# Patient Record
Sex: Male | Born: 2000 | Race: White | Hispanic: No | Marital: Single | Smoking: Never smoker
Health system: Southern US, Community
[De-identification: ages and names within clinical notes are randomized; demographics above are authoritative.]

---
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
## 2020-09-29 ENCOUNTER — Other Ambulatory Visit: Payer: Self-pay

## 2020-09-29 ENCOUNTER — Encounter: Payer: Self-pay | Admitting: Emergency Medicine

## 2020-09-29 ENCOUNTER — Emergency Department
Admission: EM | Admit: 2020-09-29 | Discharge: 2020-09-29 | Disposition: A | Discharge status: Home / Self Care | Payer: No Typology Code available for payment source | Attending: Emergency Medicine | Admitting: Emergency Medicine

## 2020-09-29 ENCOUNTER — Emergency Department: Payer: No Typology Code available for payment source

## 2020-09-29 DIAGNOSIS — Y92009 Unspecified place in unspecified non-institutional (private) residence as the place of occurrence of the external cause: Secondary | ICD-10-CM | POA: Insufficient documentation

## 2020-09-29 DIAGNOSIS — W500XXA Accidental hit or strike by another person, initial encounter: Secondary | ICD-10-CM | POA: Insufficient documentation

## 2020-09-29 DIAGNOSIS — H02846 Edema of left eye, unspecified eyelid: Secondary | ICD-10-CM | POA: Insufficient documentation

## 2020-09-29 DIAGNOSIS — G44311 Acute post-traumatic headache, intractable: Secondary | ICD-10-CM | POA: Insufficient documentation

## 2020-09-29 DIAGNOSIS — Y9389 Activity, other specified: Secondary | ICD-10-CM | POA: Insufficient documentation

## 2020-09-29 DIAGNOSIS — S0990XA Unspecified injury of head, initial encounter: Secondary | ICD-10-CM | POA: Diagnosis not present

## 2020-09-29 DIAGNOSIS — R600 Localized edema: Secondary | ICD-10-CM

## 2020-09-29 NOTE — Discharge Instructions (Addendum)
You were seen today for headache, dizziness, intermittent blurred vision, swelling around her left eye after head injury.  The CT of your head was negative for acute findings.  The CT of your face was negative for any bony fractures.  We recommend ice for 10 minutes twice daily and ibuprofen 400 mg every 8 hours as needed with food.  Follow-up if you develop worsening headache, blurred vision, confusion, amnesia, nausea, vomiting.

## 2020-09-29 NOTE — ED Provider Notes (Signed)
[facility] Emergency Department Provider Note ____________________________________________  Time seen: 1430  I have reviewed the triage vital signs and the nursing notes.  HISTORY  Chief Complaint  Head Injury   HPI Frederick Dixon is a 20 y.o. male presents to the ER today with complaint of head injury.  He reports he was playing kickball approximately an hour and half ago when he ran directly into someone else's face.  He reports that her jaw hit his left eye and possibly nose.  He describes the pain as sore and achy.  He reports associated headache which he describes as throbbing.  He reports some mild dizziness and blurred vision when he looks side to side.  He denies confusion, excessive sleepiness, bloody or clear discharge from the nostrils, neck pain, nausea or vomiting.  He has not taken any medications for this PTA.  No past medical history on file.  There are no problems to display for this patient.     Prior to Admission medications   Not on File    Allergies Patient has no known allergies.  No family history on file.  Social History Social History   Tobacco Use  . Smoking status: Never Smoker  . Smokeless tobacco: Never Used  Vaping Use  . Vaping Use: Never used  Substance Use Topics  . Alcohol use: Yes  . Drug use: Never    Review of Systems  Constitutional: Negative for fever. Eyes: Positive for blurred vision when he looks side to side. ENT: Negative for bloody or clear discharge from the nose. Cardiovascular: Negative for chest pain or chest tightness. Respiratory: Negative for cough or shortness of breath. Gastrointestinal: Negative for nausea or vomiting Musculoskeletal: Negative for neck or back pain. Skin: Positive for swelling around the left eye.  Negative for abrasion. Neurological: Positive for headache, dizziness.  Negative for focal weakness, tingling or  numbness. ____________________________________________  PHYSICAL EXAM:  VITAL SIGNS: ED Triage Vitals  Enc Vitals Group     BP 09/29/20 1356 103/68     Pulse Rate 09/29/20 1356 77     Resp 09/29/20 1356 20     Temp 09/29/20 1356 98.5 F (36.9 C)     Temp Source 09/29/20 1356 Oral     SpO2 09/29/20 1356 97 %     Weight 09/29/20 1357 115 lb (52.2 kg)     Height 09/29/20 1357 5\' 8"  (1.727 m)     Head Circumference --      Peak Flow --      Pain Score 09/29/20 1357 6     Pain Loc --      Pain Edu? --      Excl. in GC? --     Constitutional: Alert and oriented. Well appearing and in no distress. Head: Normocephalic. Eyes: Sclera white.  Conjunctivae are normal. PERRL. Normal extraocular movements Ears: Canals clear. TMs intact bilaterally. Nose: No discharge noted. Cardiovascular: Normal rate, regular rhythm. Respiratory: Normal respiratory effort. No wheezes/rales/rhonchi. Musculoskeletal: Normal flexion, extension and rotation of the cervical spine.  No bony tenderness noted over the cervical spine.  Shoulder shrugs equal. Neurologic:  Normal gait without ataxia. Normal speech and language. No gross focal neurologic deficits are appreciated. Skin:  Skin is warm, dry and intact.  Left periorbital swelling noted. ____________________________________________   RADIOLOGY   Imaging Orders     CT Head Wo Contrast     CT Maxillofacial Wo Contrast IMPRESSION:  1. No acute intracranial abnormality.  2. No acute  displaced facial fracture.   ____________________________________________   INITIAL IMPRESSION / ASSESSMENT AND PLAN / ED COURSE  Acute Headache, Dizziness, Blurred Vision, Left Periorbital Swelling, Nasal Swelling s/p Head Injury:  DDx include posttraumatic headache, concussion, soft tissue swelling of the face, swelling of the nasal bridge. CT head negative CT maxillofacial negative D/w pt this is likely just soft tissue swelling and posttraumatic  headache Recommend ice for 10 minutes to left eye BID prn Recommend Ibuprofen 400 mg every 8 hours as needed with food- he declines RX for this Discussed s/s of concussion Return precautions discussed     ____________________________________________  FINAL CLINICAL IMPRESSION(S) / ED DIAGNOSES  Final diagnoses:  Injury of head, initial encounter  Periorbital edema of left eye  Intractable acute post-traumatic headache      Lorre Munroe, NP 09/29/20 1517    Delton Prairie, MD 09/29/20 [phone]

## 2020-09-29 NOTE — ED Notes (Signed)
Patient transported to CT 

## 2020-09-29 NOTE — ED Triage Notes (Signed)
Pt via POV from home. Pt was playing kickball, accidentally head butted someone approx 1 hour ago. Denies LOC. Swelling noted around the L eye. Pt states he also chipped one of his teeth. Pt is A&Ox4 and NAD.

## 2022-04-14 ENCOUNTER — Ambulatory Visit (INDEPENDENT_AMBULATORY_CARE_PROVIDER_SITE_OTHER): Payer: 59 | Admitting: Adult Health

## 2022-04-14 ENCOUNTER — Encounter: Payer: Self-pay | Admitting: Adult Health

## 2022-04-14 VITALS — BP 107/79 | HR 102 | Temp 98.9°F | Wt 124.0 lb

## 2022-04-14 DIAGNOSIS — J309 Allergic rhinitis, unspecified: Secondary | ICD-10-CM | POA: Diagnosis not present

## 2022-04-14 DIAGNOSIS — R051 Acute cough: Secondary | ICD-10-CM

## 2022-04-14 NOTE — Progress Notes (Signed)
[address] [place], [place] [address] Phone: [phone] Fax: [phone]   Office Visit Note  Patient Name: Frederick Dixon  Date of [id number]  Med Rec number [id number]  Date of Service: 04/14/2022  Patient has no known allergies.  Chief Complaint  Patient presents with   Cough   Nasal Congestion     Cough Associated symptoms include chills, ear pain, headaches and a sore throat. Pertinent negatives include no chest pain or fever.    Patient reports he has bee sick for about 10 days. It started with cough, and progressed to congestion, and now sore throat and head congestion. He has taken some decongestants.  He denies headache, fever or chills.  He does have some ear pressure.  Denies any close contacts who are sick.   Current Medication:  Outpatient Encounter Medications as of 04/14/2022  Medication Sig   FLUoxetine (PROZAC) 20 MG tablet Take 20 mg by mouth daily.   No facility-administered encounter medications on file as of 04/14/2022.      Medical History: History reviewed. No pertinent past medical history.   Vital Signs: BP 107/79   Pulse (!) 102   Temp 98.9 F (37.2 C) (Tympanic)   Wt 124 lb (56.2 kg)   SpO2 99%   BMI 18.85 kg/m    Review of Systems  Constitutional:  Positive for chills. Negative for diaphoresis, fatigue and fever.  HENT:  Positive for congestion, ear pain, sinus pressure and sore throat.   Eyes:  Negative for pain and itching.  Respiratory:  Positive for cough.   Cardiovascular:  Negative for chest pain.  Gastrointestinal:  Negative for diarrhea, nausea and vomiting.  Neurological:  Positive for headaches.    Physical Exam Vitals and nursing note reviewed.  Constitutional:      Appearance: Normal appearance.  HENT:     Head: Normocephalic.     Right Ear: Tympanic membrane and ear canal normal.     Left Ear: Tympanic membrane and ear canal normal.     Nose: Nose normal.     Right  Turbinates: Pale.     Left Turbinates: Pale.     Right Sinus: No maxillary sinus tenderness or frontal sinus tenderness.     Left Sinus: No frontal sinus tenderness.     Mouth/Throat:     Mouth: Mucous membranes are moist.  Eyes:     Pupils: Pupils are equal, round, and reactive to light.  Cardiovascular:     Rate and Rhythm: Normal rate.  Pulmonary:     Effort: Pulmonary effort is normal. No respiratory distress.     Breath sounds: No wheezing or rales.  Lymphadenopathy:     Cervical: No cervical adenopathy.  Neurological:     Mental Status: He is alert.    Assessment/Plan: 1. Allergic rhinitis, unspecified seasonality, unspecified trigger Discussed using Daily allergy medication such as Zyrtec or Claritin. Also instructed patient to use Flonase, Two sprays in each nostril twice daily. Follow up via MyChart messenger if symptoms fail to improve or may return to clinic as needed for worsening symptoms.    2. Acute cough Discussed albuterol inhaler, pt declined inhaler at this time.  Discussed taking OTC meds like nyquil or delsym for cough.     General Counseling: skye rodarte understanding of the findings of todays visit and agrees with plan of treatment. I have discussed any further diagnostic evaluation that may be needed or ordered today. We also reviewed his medications today. he has  been encouraged to call the office with any questions or concerns that should arise related to todays visit.   No orders of the defined types were placed in this encounter.   No orders of the defined types were placed in this encounter.   Time spent:20 Minutes Time spent includes review of chart, medications, test results, and follow up plan with the patient.    Johnna Acosta AGNP-C Nurse Practitioner

## 2022-06-01 ENCOUNTER — Ambulatory Visit (INDEPENDENT_AMBULATORY_CARE_PROVIDER_SITE_OTHER): Payer: 59 | Admitting: Adult Health

## 2022-06-01 ENCOUNTER — Encounter: Payer: Self-pay | Admitting: Adult Health

## 2022-06-01 VITALS — HR 91 | Temp 96.6°F

## 2022-06-01 DIAGNOSIS — H938X1 Other specified disorders of right ear: Secondary | ICD-10-CM | POA: Diagnosis not present

## 2022-06-01 NOTE — Progress Notes (Signed)
[facility] [address] [place], [place] [address] Phone: [phone] Fax: [phone]   Office Visit Note  Patient Name: Frederick Dixon  Date of [id number]  Med Rec number [id number]  Date of Service: 06/01/2022  Patient has no known allergies.  Chief Complaint  Patient presents with   Ear Fullness    Right     Ear Fullness  Pertinent negatives include no coughing, diarrhea or vomiting.    Patient reports he was sick last week, and his right ear started feeling clogged/pressure, and he has decreased hearing.  Denies any pain.  He ha some congestion in his head first thing in the morning, and has some lingering cough from last weeks illness.  Denies fever, chills.   Current Medication:  Outpatient Encounter Medications as of 06/01/2022  Medication Sig   FLUoxetine (PROZAC) 20 MG tablet Take 20 mg by mouth daily.   No facility-administered encounter medications on file as of 06/01/2022.      Medical History: History reviewed. No pertinent past medical history.   Vital Signs: Pulse 91   Temp (!) 96.6 F (35.9 C) (Tympanic)   SpO2 98%    Review of Systems  Constitutional:  Negative for fatigue and fever.  HENT:  Positive for congestion and ear pain. Negative for postnasal drip and sinus pressure.   Eyes:  Positive for redness.  Respiratory:  Negative for cough.   Cardiovascular:  Negative for chest pain.  Gastrointestinal:  Negative for diarrhea, nausea and vomiting.    Physical Exam Vitals and nursing note reviewed.  Constitutional:      Appearance: Normal appearance.  HENT:     Head: Normocephalic.     Right Ear: External ear normal. Decreased hearing noted. A middle ear effusion is present. There is no impacted cerumen. Tympanic membrane is not injected or erythematous.     Left Ear: Tympanic membrane and ear canal normal. Tympanic membrane is not injected or erythematous.     Nose: Congestion present.     Mouth/Throat:     Mouth:  Mucous membranes are moist.  Eyes:     Pupils: Pupils are equal, round, and reactive to light.  Lymphadenopathy:     Cervical: No cervical adenopathy.  Neurological:     Mental Status: He is alert.    Assessment/Plan: 1. Ear pressure, right Take Sudafed per package instructions as discussed.  Use Flonase 2 sprays each nostril, twice daily.  Follow up via MyChart messenger if symptoms fail to improve or may return to clinic as needed for worsening symptoms.       General Counseling: kitai purdom understanding of the findings of todays visit and agrees with plan of treatment. I have discussed any further diagnostic evaluation that may be needed or ordered today. We also reviewed his medications today. he has been encouraged to call the office with any questions or concerns that should arise related to todays visit.   No orders of the defined types were placed in this encounter.   No orders of the defined types were placed in this encounter.   Time spent:15 Minutes Time spent includes review of chart, medications, test results, and follow up plan with the patient.    Johnna Acosta AGNP-C Nurse Practitioner

## 2022-06-07 IMAGING — CT CT HEAD W/O CM
3 series · 15 of 47 positions shown, 18 images · non-contrast
Comparison: None.

CLINICAL DATA: Trauma. collided with a teammate while playing
kickball.

EXAM:
CT HEAD WITHOUT CONTRAST
CT MAXILLOFACIAL WITHOUT CONTRAST
TECHNIQUE: Multidetector CT imaging of the head and maxillofacial structures
were performed using the standard protocol without intravenous
contrast. Multiplanar CT image reconstructions of the maxillofacial
structures were also generated.

[Series 2: head wo · axial · 0.44mm/px · z∈[-123,+2]mm · 9 of 30 slices shown, 12 images]
[im 3/30  brain]
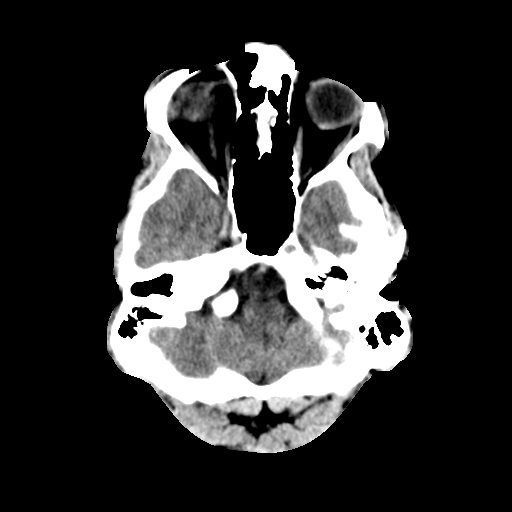
[im 3/30  bone]
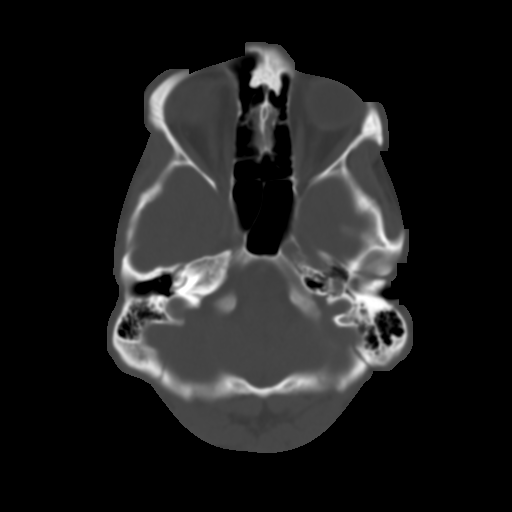
[im 6/30  brain]
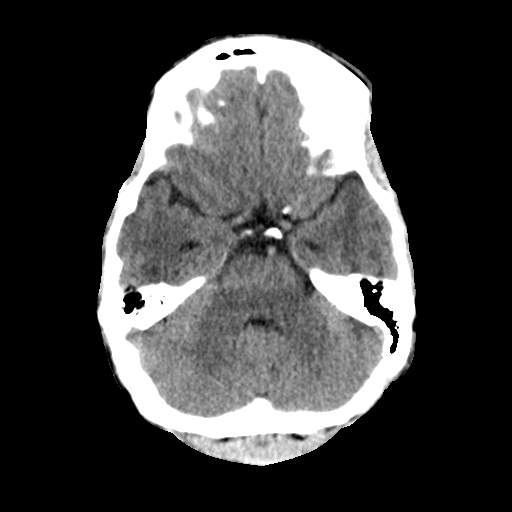
[im 9/30  brain]
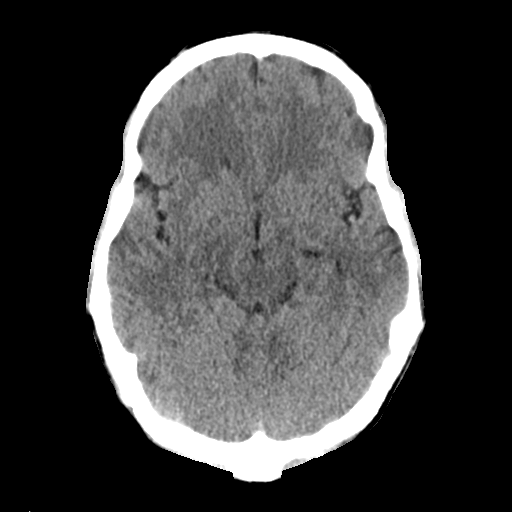
[im 12/30  brain]
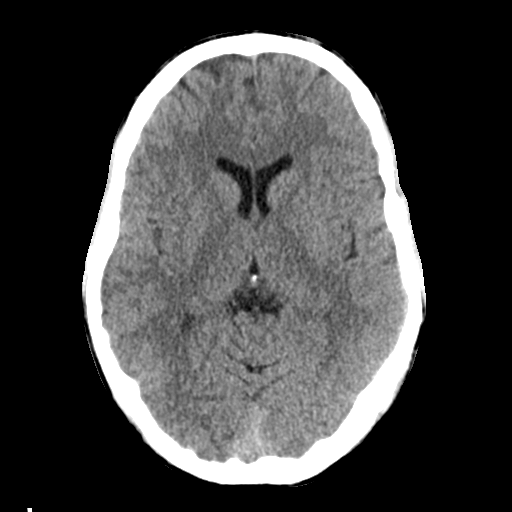
[im 16/30  brain]
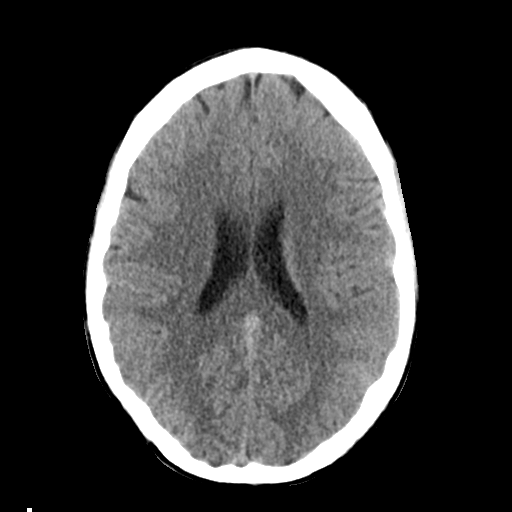
[im 16/30  bone]
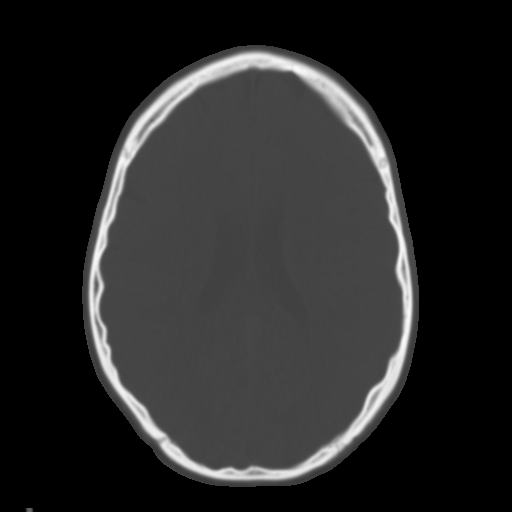
[im 19/30  brain]
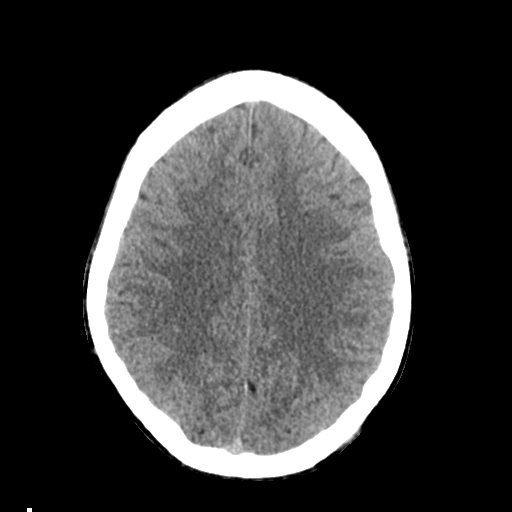
[im 22/30  brain]
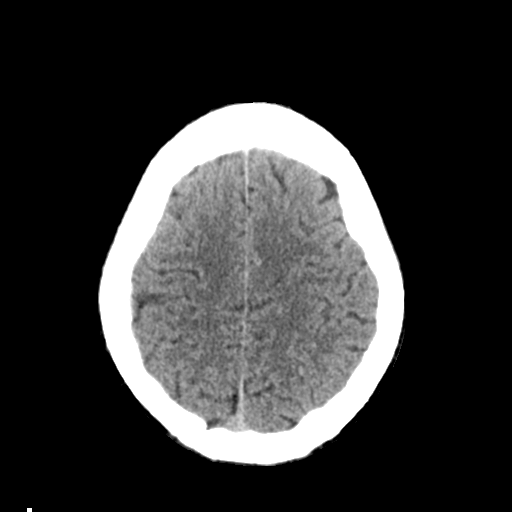
[im 25/30  brain]
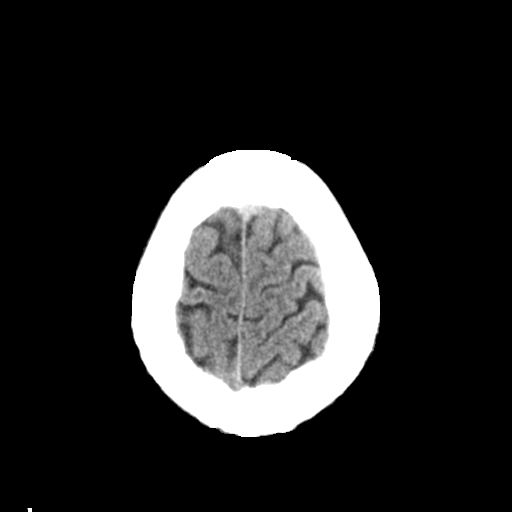
[im 28/30  brain]
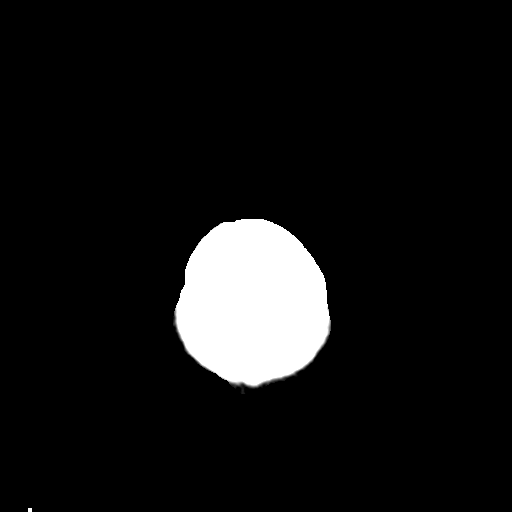
[im 28/30  bone]
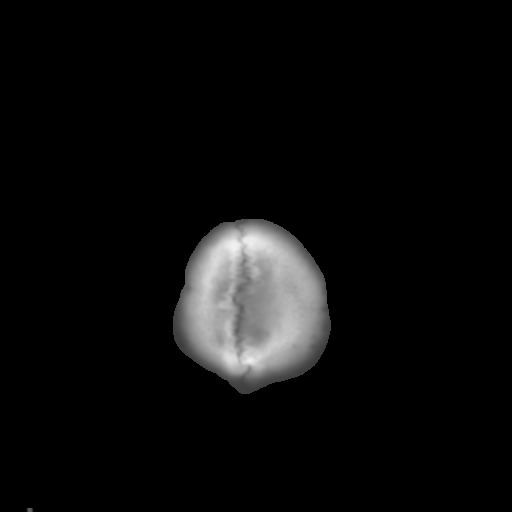

[Series 4: coronal soft tissue · coronal · 0.31mm/px · 3 of 70 slices shown]
[im 24/70  brain]
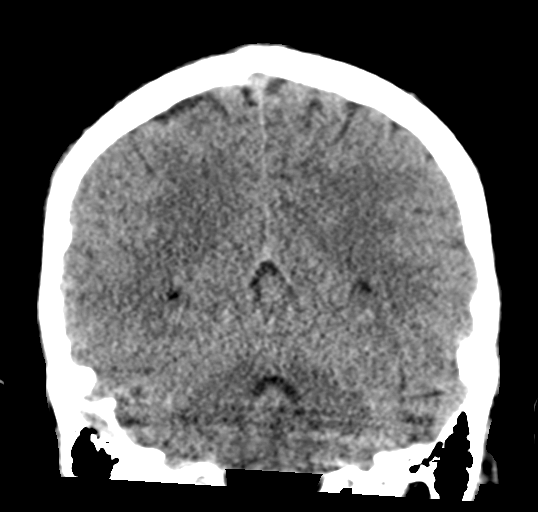
[im 31/70  brain]
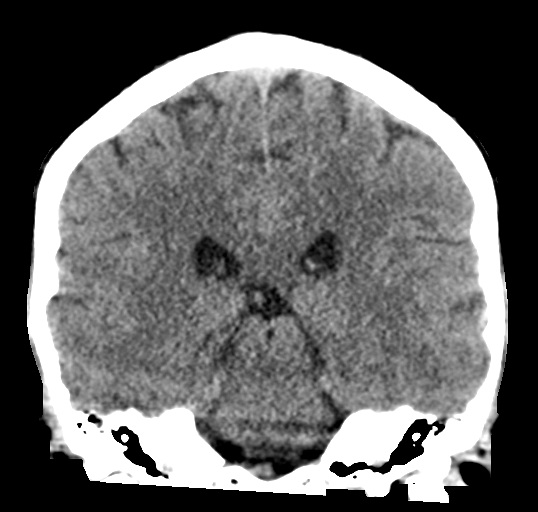
[im 39/70  brain]
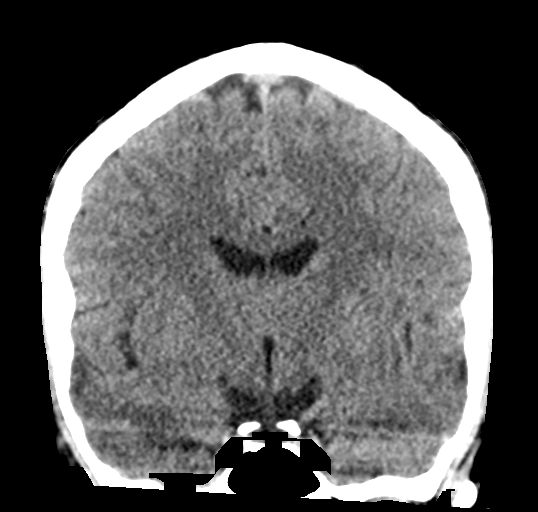

[Series 5: sagittal soft tissue · sagittal · 0.31mm/px · 3 of 56 slices shown]
[im 19/56  brain]
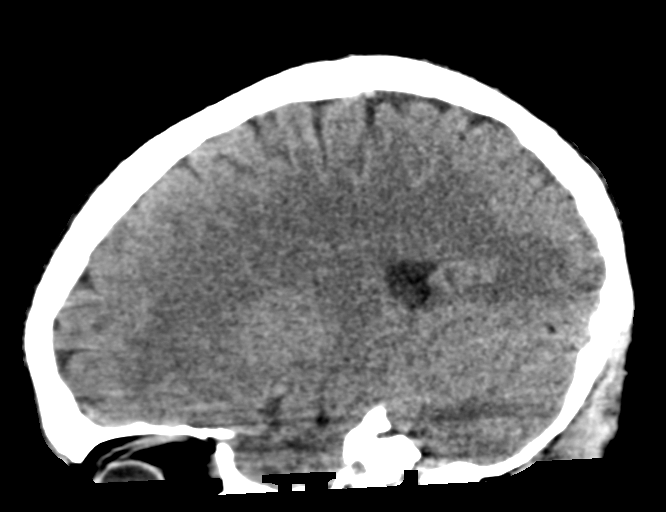
[im 28/56  brain]
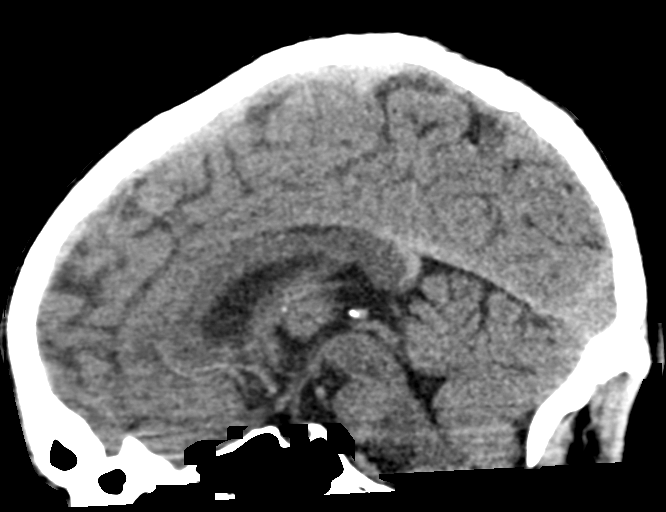
[im 37/56  brain]
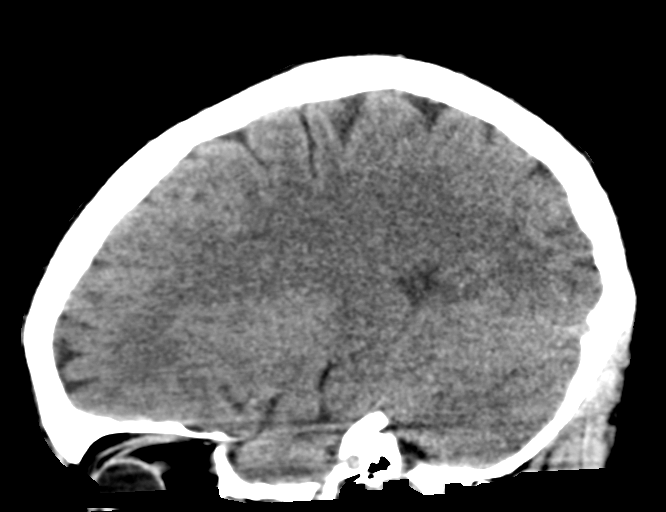

[15 of 47 positions shown; findings below may reference images not displayed]

FINDINGS: CT HEAD FINDINGS

Brain:

No evidence of large-territorial acute infarction. No parenchymal
hemorrhage. No mass lesion. No extra-axial collection.

No mass effect or midline shift. No hydrocephalus. Basilar cisterns
are patent.

Vascular: No hyperdense vessel.

Skull: No acute fracture or focal lesion.

Other: None.

CT MAXILLOFACIAL FINDINGS

Osseous: No fracture or mandibular dislocation. No destructive
process.

Sinuses/Orbits: Paranasal sinuses and mastoid air cells are clear.
The orbits are unremarkable.

Soft tissues: Negative.
IMPRESSION: 1. No acute intracranial abnormality.
2. No acute displaced facial fracture.
